# Patient Record
Sex: Female | Born: 2005 | Race: White | Hispanic: No | Marital: Single | Smoking: Never smoker
Health system: Southern US, Community
[De-identification: ages and names within clinical notes are randomized; demographics above are authoritative.]

## PROBLEM LIST (undated history)

## (undated) DIAGNOSIS — F419 Anxiety disorder, unspecified: Secondary | ICD-10-CM

## (undated) DIAGNOSIS — F32A Depression, unspecified: Secondary | ICD-10-CM

## (undated) DIAGNOSIS — F329 Major depressive disorder, single episode, unspecified: Secondary | ICD-10-CM

---
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
## 2023-08-03 ENCOUNTER — Other Ambulatory Visit: Payer: Self-pay

## 2023-08-03 ENCOUNTER — Emergency Department
Admission: EM | Admit: 2023-08-03 | Discharge: 2023-08-03 | Disposition: A | Discharge status: Home / Self Care | Attending: Emergency Medicine | Admitting: Emergency Medicine

## 2023-08-03 DIAGNOSIS — R202 Paresthesia of skin: Secondary | ICD-10-CM | POA: Insufficient documentation

## 2023-08-03 DIAGNOSIS — R109 Unspecified abdominal pain: Secondary | ICD-10-CM | POA: Insufficient documentation

## 2023-08-03 DIAGNOSIS — N946 Dysmenorrhea, unspecified: Secondary | ICD-10-CM

## 2023-08-03 LAB — COMPREHENSIVE METABOLIC PANEL WITH GFR
ALT: 12 U/L (ref 0–44)
AST: 14 U/L — ABNORMAL LOW (ref 15–41)
Albumin: 4.2 g/dL (ref 3.5–5.0)
Alkaline Phosphatase: 40 U/L (ref 38–126)
Anion gap: 8 (ref 5–15)
BUN: 13 mg/dL (ref 6–20)
CO2: 24 mmol/L (ref 22–32)
Calcium: 9.2 mg/dL (ref 8.9–10.3)
Chloride: 104 mmol/L (ref 98–111)
Creatinine, Ser: 0.67 mg/dL (ref 0.44–1.00)
GFR, Estimated: >60 mL/min (ref >60)
Glucose, Bld: 121 mg/dL — ABNORMAL HIGH (ref 70–99)
Potassium: 3.4 mmol/L — ABNORMAL LOW (ref 3.5–5.1)
Sodium: 136 mmol/L (ref 135–145)
Total Bilirubin: 0.4 mg/dL (ref 0.0–1.2)
Total Protein: 7.1 g/dL (ref 6.5–8.1)

## 2023-08-03 LAB — LIPASE, BLOOD: Lipase: 31 U/L (ref 11–51)

## 2023-08-03 LAB — URINALYSIS, ROUTINE W REFLEX MICROSCOPIC
Bilirubin Urine: NEGATIVE
Glucose, UA: NEGATIVE mg/dL
Ketones, ur: NEGATIVE mg/dL
Leukocytes,Ua: NEGATIVE
Nitrite: NEGATIVE
Protein, ur: 30 mg/dL — AB
Specific Gravity, Urine: 1.026 (ref 1.005–1.030)
Squamous Epithelial / HPF: 0 /HPF (ref 0–5)
pH: 5 (ref 5.0–8.0)

## 2023-08-03 LAB — CBC
HCT: 38.1 % (ref 36.0–46.0)
Hemoglobin: 12.7 g/dL (ref 12.0–15.0)
MCH: 30.8 pg (ref 26.0–34.0)
MCHC: 33.3 g/dL (ref 30.0–36.0)
MCV: 92.3 fL (ref 80.0–100.0)
Platelets: 304 K/uL (ref 150–400)
RBC: 4.13 MIL/uL (ref 3.87–5.11)
RDW: 12.2 % (ref 11.5–15.5)
WBC: 8.4 K/uL (ref 4.0–10.5)
nRBC: 0 % (ref 0.0–0.2)

## 2023-08-03 LAB — PREGNANCY, URINE: Preg Test, Ur: NEGATIVE

## 2023-08-03 NOTE — ED Triage Notes (Signed)
 First Nurse Note: Patient to ED via [facility] from work for abd pain. PT states today is 1st day of her period. Sharp abd pain with leg pain. Same thing occurred 3 years ago. States difficulty urinating with but is going more frequent.   127/76 97% RA 89 HR 147 cbg

## 2023-08-03 NOTE — ED Triage Notes (Signed)
 Pt states she changed regular sized pads 4-5x today. States bleeding is a lot heavier than normal and she feels very weak. States both legs feel like they're being stabbed, started at work.

## 2023-08-03 NOTE — ED Provider Notes (Addendum)
 [facility] Provider Note    Event Date/Time   First MD Initiated Contact with Patient 08/03/23 1755     (approximate)  History   Chief Complaint: Abdominal Pain and Weakness  HPI  Sharon Ellis is a 18 y.o. female with a past medical history anxiety presents to the emergency department for abdominal cramping and tingling/numbness sensation in her legs.  According to the patient she has a menstrual cycle each month however she states on occasion she will have severe cramping and heavy bleeding.  Patient states this has happened before, her period started today and she is experiencing abdominal cramping heavy bleeding and was feeling a paresthesias tingling/numbness sensation in her legs that lasted about an hour and then resolved.  Patient denies any chest pain or shortness of breath.  Patient states this has happened before when she comes to get evaluated she was told everything is normal.  Patient denies any symptoms currently.  Physical Exam   Triage Vital Signs: ED Triage Vitals  Encounter Vitals Group     BP 08/03/23 1659 101/71     Girls Systolic BP Percentile --      Girls Diastolic BP Percentile --      Boys Systolic BP Percentile --      Boys Diastolic BP Percentile --      Pulse Rate 08/03/23 1659 81     Resp 08/03/23 1659 18     Temp 08/03/23 1659 97.7 F (36.5 C)     Temp Source 08/03/23 1659 Oral     SpO2 08/03/23 1659 100 %     Weight 08/03/23 1656 118 lb (53.5 kg)     Height 08/03/23 1656 5' 4 (1.626 m)     Head Circumference --      Peak Flow --      Pain Score 08/03/23 1656 4     Pain Loc --      Pain Education --      Exclude from Growth Chart --     Most recent vital signs: Vitals:   08/03/23 1659  BP: 101/71  Pulse: 81  Resp: 18  Temp: 97.7 F (36.5 C)  SpO2: 100%    General: Awake, no distress.  CV:  Good peripheral perfusion.  Regular rate and rhythm  Resp:  Normal effort.  Equal breath sounds bilaterally.  Abd:  No  distention.  Soft, nontender.  No rebound or guarding.  ED Results / Procedures / Treatments   MEDICATIONS ORDERED IN ED: Medications - No data to display   IMPRESSION / MDM / ASSESSMENT AND PLAN / ED COURSE  I reviewed the triage vital signs and the nursing notes.  Patient's presentation is most consistent with acute presentation with potential threat to life or bodily function.  Patient presents emergency department for examination/medical evaluation.  Patient states her menstrual cycle started today and was more heavy than typical with abdominal cramping and a feeling of paresthesias/tingling/numbness in her legs.  States it happened in the bilateral lower extremities.  Patient states this has happened before and she has been told that all of her workup looks normal.  Patient is concerned she could have endometriosis.  After listening to the patient's story/history I believe endometriosis would be less likely given the symptoms are so brief lasting minutes to hours and then resolving.  Patient's workup today shows reassuring results with normal CBC normal chemistry normal lipase and a negative pregnancy test.  Given the patient's reassuring workup I believe  the patient is safe for discharge home as her vital signs are reassuring physical exam is reassuring.  I discussed with the patient if the patient's paresthesias/tingling sensation does not resolve within the next 1 to 2 days she should return to the emergency department or follow-up with her doctor.  I discussed returning sooner if she develops worsening numbness any weakness or any other symptom concerning to yourself.  FINAL CLINICAL IMPRESSION(S) / ED DIAGNOSES   Paresthesias   Note:  This document was prepared using Dragon voice recognition software and may include unintentional dictation errors.   Dorothyann Drivers, MD 08/03/23 ROANN    Dorothyann Drivers, MD 08/03/23 [phone]

## 2023-08-03 NOTE — ED Notes (Signed)
 Called lab about UA, specimen still in process.
